# Patient Record
Sex: Male | Born: 1985 | Race: White | Hispanic: No | Marital: Married | Smoking: Never smoker
Health system: Southern US, Community
[De-identification: ages and names within clinical notes are randomized; demographics above are authoritative.]

## PROBLEM LIST (undated history)

## (undated) HISTORY — PX: FRACTURE SURGERY: SHX138

## (undated) HISTORY — PX: CHEST TUBE INSERTION: SHX231

## (undated) HISTORY — PX: SHOULDER SURGERY: SHX246

---

## 2009-06-26 ENCOUNTER — Encounter: Admission: RE | Admit: 2009-06-26 | Discharge: 2009-06-26 | Payer: Self-pay | Admitting: Occupational Medicine

## 2010-05-10 IMAGING — CR DG CHEST 1V
1 series · 1 of 1 positions shown · non-contrast
Comparison: None

CLINICAL DATA: Pre employment physical examination.

CHEST - 1 VIEW

[view not recorded]
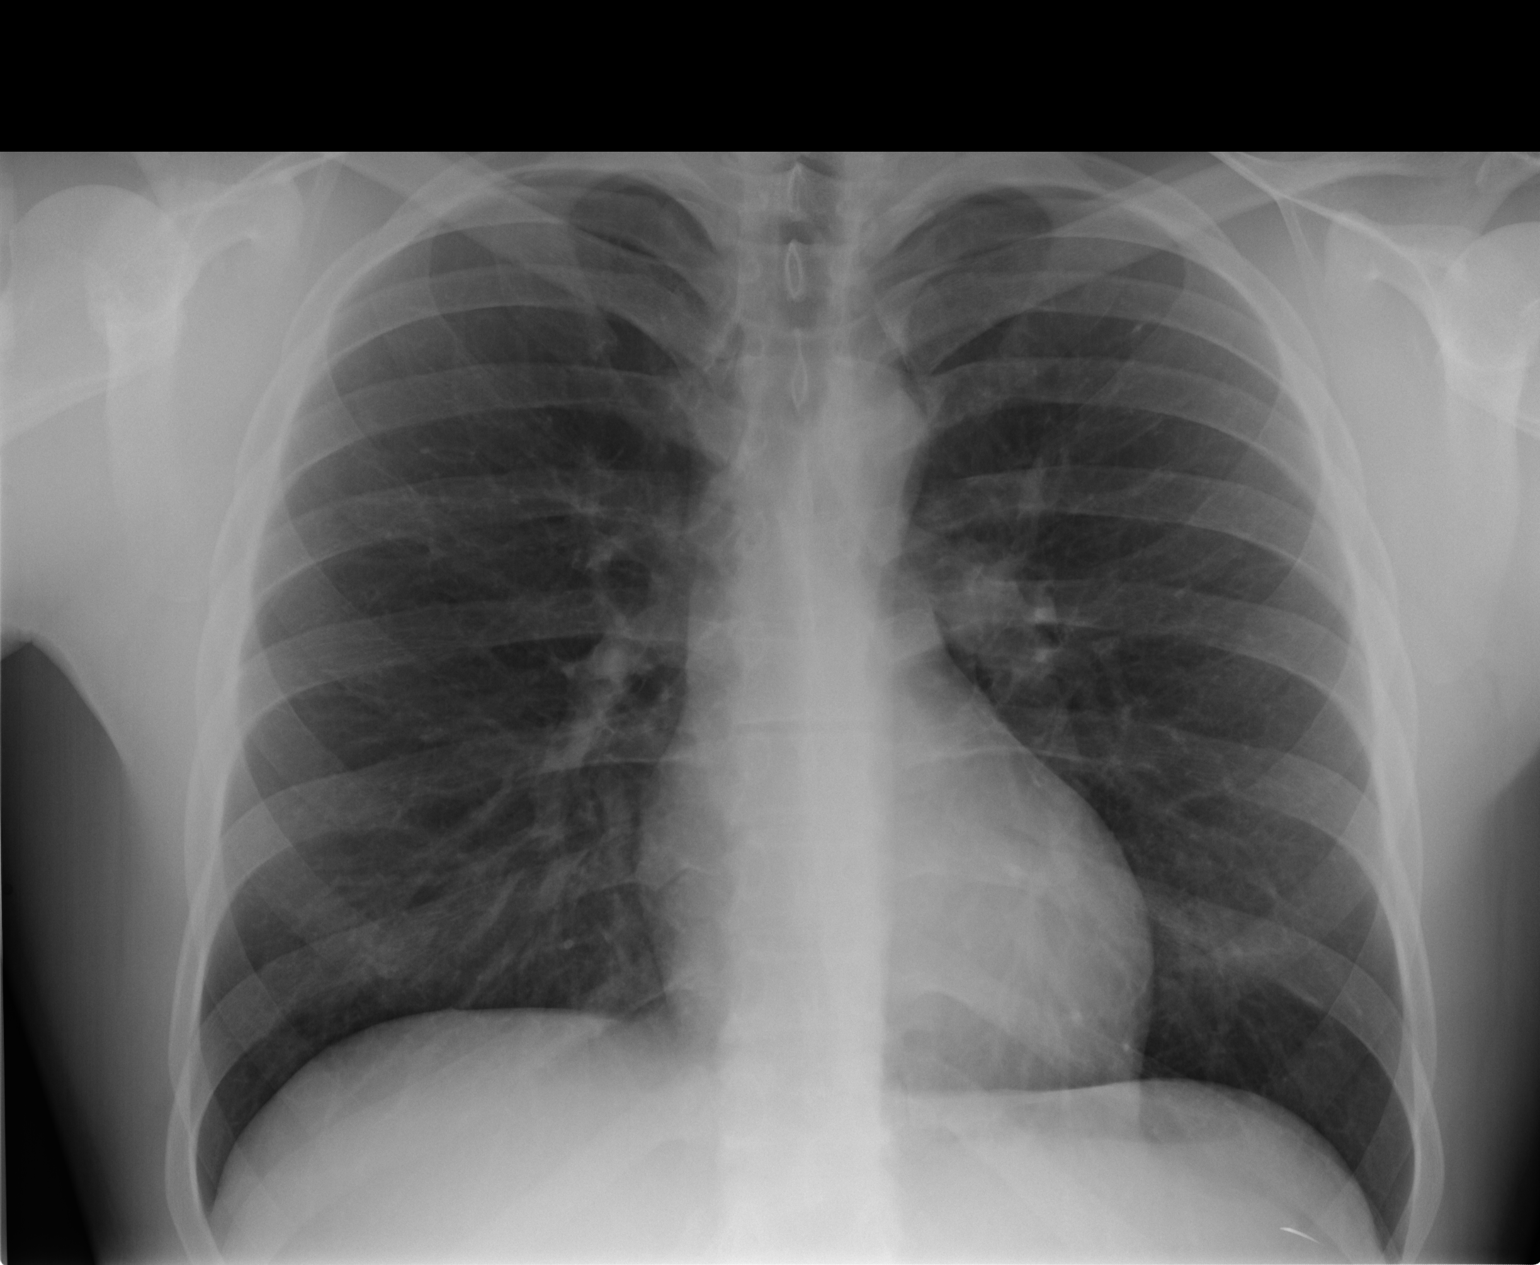

[1 of 1 positions shown; findings below may reference images not displayed]

FINDINGS: The heart size and mediastinal contours are normal.  The
lungs are clear.  There is no pleural effusion or pneumothorax.
There is a linear radiodensity projecting over the left upper
abdomen which may be artifactual.
IMPRESSION: No active cardiopulmonary process.  Question artifact versus
foreign body overlying the left upper quadrant of the abdomen.

## 2016-10-25 ENCOUNTER — Emergency Department (HOSPITAL_COMMUNITY)
Admission: EM | Admit: 2016-10-25 | Discharge: 2016-10-25 | Disposition: A | Discharge status: Home / Self Care | Payer: Worker's Compensation | Attending: Emergency Medicine | Admitting: Emergency Medicine

## 2016-10-25 ENCOUNTER — Encounter (HOSPITAL_COMMUNITY): Payer: Self-pay | Admitting: *Deleted

## 2016-10-25 DIAGNOSIS — S01111A Laceration without foreign body of right eyelid and periocular area, initial encounter: Secondary | ICD-10-CM | POA: Insufficient documentation

## 2016-10-25 DIAGNOSIS — Y9301 Activity, walking, marching and hiking: Secondary | ICD-10-CM | POA: Insufficient documentation

## 2016-10-25 DIAGNOSIS — Y999 Unspecified external cause status: Secondary | ICD-10-CM | POA: Diagnosis not present

## 2016-10-25 DIAGNOSIS — Y929 Unspecified place or not applicable: Secondary | ICD-10-CM | POA: Insufficient documentation

## 2016-10-25 DIAGNOSIS — S0181XA Laceration without foreign body of other part of head, initial encounter: Secondary | ICD-10-CM

## 2016-10-25 DIAGNOSIS — W2201XA Walked into wall, initial encounter: Secondary | ICD-10-CM | POA: Diagnosis not present

## 2016-10-25 MED ORDER — LIDOCAINE-EPINEPHRINE (PF) 2 %-1:200000 IJ SOLN
10.0000 mL | Freq: Once | INTRAMUSCULAR | Status: AC
  Administered 2016-10-25: 10 mL
  Filled 2016-10-25: qty 20

## 2016-10-25 NOTE — ED Notes (Signed)
Declined W/C at D/C and was escorted to lobby by RN. 

## 2016-10-25 NOTE — Discharge Instructions (Signed)
Keep your wounds clean using antibacterial soap and water, pat dry. He may apply a small amount of Neosporin ointment to wound daily. He may take ibuprofen 600 mg every 6 hours as needed for pain relief. I also recommend applying ice to affected area for 15 minutes 3-4 times daily to help with pain and swelling. Follow-up with your primary care provider or be seen at an urgent care or return to the ED in 7 days for suture removal. Please return to the Emergency Department if symptoms worsen or new onset of fever, neck stiffness, visual changes, vomiting, urinary symptoms, numbness, tingling, weakness, seizures, syncope, confusion, agitation, redness, swelling, warmth, drainage.

## 2016-10-25 NOTE — ED Provider Notes (Signed)
[facility]-EMERGENCY DEPT Provider Note    By signing my name below, I, [name], attest that this documentation has been prepared under the direction and in the presence of Melburn HakeNicole Sulo Janczak, PA-C. Electronically Signed: [name], ED Scribe. 10/25/16. 10:36 AM.   History   Chief Complaint Chief Complaint  Patient presents with  . Laceration    The history is provided by the patient and medical records. No language interpreter was used.    HPI Comments:  Randy Gutierrez is a 30 y.o. male who presents to the Emergency Department complaining of a laceration to the right eyebrow that occurred four hours ago. 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 He reports associated bleeding that has since been controlled. He has not done anything to treat the wound. He denies modifying factors. He denies LOC, numbness, tingling or weakness, dizziness, HA, visual changes, Lightheadedness, dizziness, bruising or any other wounds, neck pain. He denies any chronic medical problems. He reports his tetanus vaccination is UTD.  History reviewed. No pertinent past medical history.  There are no active problems to display for this patient.   History reviewed. No pertinent surgical history.     Home Medications    Prior to Admission medications   Not on File    Family History History reviewed. No pertinent family history.  Social History Social History  Substance Use Topics  . Smoking status: Never Smoker  . Smokeless tobacco: Not on file  . Alcohol use No     Allergies   Patient has no known allergies.   Review of Systems Review of Systems  Eyes: Negative for visual disturbance.  Musculoskeletal: Negative for neck pain.  Skin: Positive for wound. Negative for color change.  Neurological: Negative for dizziness, syncope, weakness, numbness and headaches.     Physical Exam Updated Vital Signs BP 123/71 (BP  Location: Right Arm)   Pulse (!) 55   Temp 98.1 F (36.7 C) (Oral)   Resp 16   SpO2 99%   Physical Exam  Constitutional: He is oriented to person, place, and time. He appears well-developed and well-nourished.  HENT:  Head: Normocephalic. Head is without raccoon's eyes, without Battle's sign, without abrasion and without laceration.  Right Ear: Tympanic membrane normal. No hemotympanum.  Left Ear: Tympanic membrane normal. No hemotympanum.  Nose: Nose normal. No sinus tenderness, nasal deformity, septal deviation or nasal septal hematoma. No epistaxis.  Mouth/Throat: Uvula is midline, oropharynx is clear and moist and mucous membranes are normal. No oropharyngeal exudate, posterior oropharyngeal edema, posterior oropharyngeal erythema or tonsillar abscesses.  1 cm superficial laceration at right eyebrow. No active bleeding. No tenderness, deformity or step off noted to periorbital region.  Eyes: Conjunctivae and EOM are normal. Pupils are equal, round, and reactive to light. Right eye exhibits no discharge. Left eye exhibits no discharge. No scleral icterus.  Neck: Normal range of motion. Neck supple.  Cardiovascular: Normal rate.   Pulmonary/Chest: Effort normal.  Abdominal: Soft. He exhibits no distension.  Musculoskeletal: Normal range of motion. He exhibits no edema.  No cervical, thoracic, or lumbar spine midline TTP.  Full ROM of bilateral upper and lower extremities with 5/5 strength.   2+ radial and PT pulses. Sensation grossly intact.  Neurological: He is alert and oriented to person, place, and time. He has normal strength. No cranial nerve deficit or sensory deficit. Coordination and gait normal.  Skin: Skin is warm and  dry.  Nursing note and vitals reviewed.    ED Treatments / Results  DIAGNOSTIC STUDIES: Oxygen Saturation is 99% on RA, normal by my interpretation.   COORDINATION OF CARE: 10:04 AM- Will suture wound. Pt verbalizes understanding and agrees to  plan.  Medications  lidocaine-EPINEPHrine (XYLOCAINE W/EPI) 2 %-1:200000 (PF) injection 10 mL (10 mLs Infiltration Given 10/25/16 1035)    Labs (all labs ordered are listed, but only abnormal results are displayed) Labs Reviewed - No data to display  EKG  EKG Interpretation None       Radiology No results found.  Procedures .Marland KitchenLaceration Repair Date/Time: 10/25/2016 10:50 AM Performed by: Barrett Henle Authorized by: Barrett Henle   Consent:    Consent obtained:  Verbal   Consent given by:  Patient Anesthesia (see MAR for exact dosages):    Anesthesia method:  Local infiltration   Local anesthetic:  Lidocaine 2% WITH epi Laceration details:    Location:  Face   Face location:  R eyebrow   Length (cm):  1 Repair type:    Repair type:  Simple Pre-procedure details:    Preparation:  Patient was prepped and draped in usual sterile fashion Exploration:    Wound exploration: wound explored through full range of motion and entire depth of wound probed and visualized     Wound extent: no foreign bodies/material noted, no muscle damage noted, no nerve damage noted, no tendon damage noted, no underlying fracture noted and no vascular damage noted     Contaminated: no   Treatment:    Area cleansed with:  Betadine and saline   Amount of cleaning:  Standard   Irrigation solution:  Sterile saline   Irrigation method:  Syringe   Visualized foreign bodies/material removed: no   Skin repair:    Repair method:  Sutures   Suture size:  6-0   Suture material:  Prolene   Suture technique:  Simple interrupted   Number of sutures:  3 Approximation:    Approximation:  Close   Vermilion border: well-aligned   Post-procedure details:    Dressing:  Open (no dressing)   Patient tolerance of procedure:  Tolerated well, no immediate complications   (including critical care time)  Medications Ordered in ED Medications  lidocaine-EPINEPHrine (XYLOCAINE W/EPI) 2  %-1:200000 (PF) injection 10 mL (10 mLs Infiltration Given 10/25/16 1035)     Initial Impression / Assessment and Plan / ED Course  I have reviewed the triage vital signs and the nursing notes.  Pertinent labs & imaging results that were available during my care of the patient were reviewed by me and considered in my medical decision making (see chart for details).  Clinical Course     Pressure irrigation performed. Wound explored and base of wound visualized in a bloodless field without evidence of foreign body.  Laceration occurred < 8 hours prior to repair which was well tolerated. Tdap UTD.  Pt has no comorbidities to effect normal wound healing. Pt discharged without antibiotics.  Discussed suture home care with patient and answered questions. Pt to follow-up for wound check and suture removal in 7 days; they are to return to the ED sooner for signs of infection. Pt is hemodynamically stable with no complaints prior to dc.    I personally performed the services described in this documentation, which was scribed in my presence. The recorded information has been reviewed and is accurate.   Final Clinical Impressions(s) / ED Diagnoses   Final diagnoses:  Facial  laceration, initial encounter    New Prescriptions New Prescriptions   No medications on file     Barrett Henleicole Elizabeth Shriya Aker, [place] JerseyPA-C 10/25/16 1051    Margarita Grizzleanielle Ray, MD 11/03/16 [phone]

## 2016-10-25 NOTE — ED Triage Notes (Signed)
Pt reports stumbling out of bed last night and fell, has small laceration to right eyebrow.

## 2019-07-07 ENCOUNTER — Ambulatory Visit (HOSPITAL_COMMUNITY)
Admission: EM | Admit: 2019-07-07 | Discharge: 2019-07-07 | Disposition: A | Discharge status: Home / Self Care | Payer: 59 | Attending: Family Medicine | Admitting: Family Medicine

## 2019-07-07 ENCOUNTER — Other Ambulatory Visit: Payer: Self-pay

## 2019-07-07 ENCOUNTER — Encounter (HOSPITAL_COMMUNITY): Payer: Self-pay | Admitting: Emergency Medicine

## 2019-07-07 DIAGNOSIS — I491 Atrial premature depolarization: Secondary | ICD-10-CM | POA: Insufficient documentation

## 2019-07-07 LAB — TSH: TSH: 1.791 u[IU]/mL (ref 0.350–4.500)

## 2019-07-07 NOTE — Discharge Instructions (Signed)
I will call you with the thyroid test result Limit caffeine See your PCP if these persist or worsen

## 2019-07-07 NOTE — ED Triage Notes (Signed)
Pt reports feeling an irregular heart beat that started yesterday.  He states the HR is actual normal, but every other beat has an extra beat.  Pt is a [employer].

## 2019-07-07 NOTE — ED Provider Notes (Signed)
[facility]    CSN: [id number] Arrival date & time: 07/07/19  6283      History   Chief Complaint Chief Complaint  Patient presents with  . irregular heartbeat    HPI Randy Gutierrez is a 33 y.o. male.   HPI  Patient is here for irregular heartbeats.  They have been happening for the last couple of days.  They go away with exercise.  They are present with rest.  They do not keep him awake at night.  He has never noticed them before.  No history of heart disease.  No family history of heart disease.  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  He eats well, exercises regularly, does not smoke, does not drink alcohol, and has no more than 2 cups of coffee a day.  He has not changed any of his habits.  No new medicines.  No new over-the-counter supplements. He can feel these irregular heartbeats.  He does not have any symptoms with him.  No chest pressure pain, no shortness of breath, no dizziness or lightheadedness. He has a yearly physical scheduled in the upcoming month and had his screening blood work done today.  He states the metabolic panel and CBC is usually done.  I will add a TSH because of his irregular heartbeat.  History reviewed. No pertinent past medical history.  There are no active problems to display for this patient.   Past Surgical History:  Procedure Laterality Date  . CHEST TUBE INSERTION    . FRACTURE SURGERY    . SHOULDER SURGERY         Home Medications    Prior to Admission medications   Not on File    Family History Family History  Problem Relation Age of Onset  . Healthy Mother   . Healthy Father     Social History Social History   Tobacco Use  . Smoking status: Never Smoker  . Smokeless tobacco: Never Used  Substance Use Topics  . Alcohol use: Yes  . Drug use: No     Allergies   Patient has no known allergies.   Review of Systems Review of Systems  Constitutional: Negative for chills  and fever.  HENT: Negative for ear pain and sore throat.   Eyes: Negative for pain and visual disturbance.  Respiratory: Negative for cough and shortness of breath.   Cardiovascular: Positive for palpitations. Negative for chest pain.  Gastrointestinal: Negative for abdominal pain and vomiting.  Genitourinary: Negative for dysuria and hematuria.  Musculoskeletal: Negative for arthralgias and back pain.  Skin: Negative for color change and rash.  Neurological: Negative for seizures and syncope.  All other systems reviewed and are negative.    Physical Exam Triage Vital Signs ED Triage Vitals  Enc Vitals Group     BP 07/07/19 1021 131/84     Pulse Rate 07/07/19 1021 63     Resp 07/07/19 1021 12     Temp 07/07/19 1021 98.6 F (37 C)     Temp Source 07/07/19 1021 Oral     SpO2 07/07/19 1021 96 %     Weight --      Height --      Head Circumference --      Peak Flow --      Pain Score 07/07/19 1017 0     Pain Loc --      Pain Edu? --      Excl. in [place]? --  No data found.  Updated Vital Signs BP 131/84 (BP Location: Left Arm)   Pulse 63   Temp 98.6 F (37 C) (Oral)   Resp 12   SpO2 96%      Physical Exam Constitutional:      General: He is not in acute distress.    Appearance: He is well-developed and normal weight.     Comments: Appears lean and athletic  HENT:     Head: Normocephalic and atraumatic.  Eyes:     Conjunctiva/sclera: Conjunctivae normal.     Pupils: Pupils are equal, round, and reactive to light.  Neck:     Musculoskeletal: Normal range of motion.  Cardiovascular:     Rate and Rhythm: Normal rate and regular rhythm.     Pulses: Normal pulses.     Heart sounds: Normal heart sounds.     Comments: He has 1-2 ectopic beats in 30 seconds of auscultation Pulmonary:     Effort: Pulmonary effort is normal. No respiratory distress.     Breath sounds: Normal breath sounds.  Abdominal:     General: There is no distension.     Palpations: Abdomen is  soft.  Musculoskeletal: Normal range of motion.  Skin:    General: Skin is warm and dry.  Neurological:     Mental Status: He is alert.  Psychiatric:        Mood and Affect: Mood normal.        Behavior: Behavior normal.      UC Treatments / Results  Labs (all labs ordered are listed, but only abnormal results are displayed) Labs Reviewed  TSH    EKG   Radiology No results found.  Procedures Procedures (including critical care time)  Medications Ordered in UC Medications - No data to display  Initial Impression / Assessment and Plan / UC Course  I have reviewed the triage vital signs and the nursing notes.  Pertinent labs & imaging results that were available during my care of the patient were reviewed by me and considered in my medical decision making (see chart for details).     EKG shows normal sinus rhythm.  Normal intervals.  1 PAC is captured.  PACs are generally are discussed with patient.  Follow-up with his primary care physician for a Holter monitor if they persist is indicated Final Clinical Impressions(s) / UC Diagnoses   Final diagnoses:  Premature atrial contractions     Discharge Instructions     I will call you with the thyroid test result Limit caffeine See your PCP if these persist or worsen   ED Prescriptions    None     Controlled Substance Prescriptions [place] Controlled Substance Registry consulted? Not Applicable   Eustace MooreNelson, Juvon Teater Sue, MD 07/07/19 1055

## 2019-07-08 ENCOUNTER — Telehealth (HOSPITAL_COMMUNITY): Payer: Self-pay | Admitting: Emergency Medicine

## 2019-07-08 NOTE — Telephone Encounter (Signed)
Patient contacted and made aware of thyroid   results, all questions answered

## 2023-11-12 ENCOUNTER — Other Ambulatory Visit: Payer: Self-pay | Admitting: Nurse Practitioner

## 2023-11-12 ENCOUNTER — Ambulatory Visit
Admission: RE | Admit: 2023-11-12 | Discharge: 2023-11-12 | Disposition: A | Discharge status: Home / Self Care | Payer: Worker's Compensation | Source: Ambulatory Visit | Attending: Nurse Practitioner | Admitting: Nurse Practitioner

## 2023-11-12 DIAGNOSIS — M25531 Pain in right wrist: Secondary | ICD-10-CM

## 2023-11-12 DIAGNOSIS — T1490XA Injury, unspecified, initial encounter: Secondary | ICD-10-CM

## 2023-11-12 DIAGNOSIS — M25431 Effusion, right wrist: Secondary | ICD-10-CM

## 2024-09-27 ENCOUNTER — Other Ambulatory Visit (HOSPITAL_BASED_OUTPATIENT_CLINIC_OR_DEPARTMENT_OTHER): Payer: Self-pay | Admitting: Family Medicine

## 2024-09-27 DIAGNOSIS — Z8249 Family history of ischemic heart disease and other diseases of the circulatory system: Secondary | ICD-10-CM

## 2024-10-10 ENCOUNTER — Ambulatory Visit (HOSPITAL_BASED_OUTPATIENT_CLINIC_OR_DEPARTMENT_OTHER)
Admission: RE | Admit: 2024-10-10 | Discharge: 2024-10-10 | Disposition: A | Discharge status: Home / Self Care | Payer: Self-pay | Source: Ambulatory Visit | Attending: Family Medicine | Admitting: Family Medicine

## 2024-10-10 DIAGNOSIS — Z8249 Family history of ischemic heart disease and other diseases of the circulatory system: Secondary | ICD-10-CM
